# Patient Record
Sex: Female | Born: 1950 | Race: White | Hispanic: No | Marital: Married | Smoking: Never smoker
Health system: Southern US, Community
[De-identification: ages and names within clinical notes are randomized; demographics above are authoritative.]

## PROBLEM LIST (undated history)

## (undated) DIAGNOSIS — J309 Allergic rhinitis, unspecified: Secondary | ICD-10-CM

## (undated) DIAGNOSIS — I10 Essential (primary) hypertension: Secondary | ICD-10-CM

## (undated) DIAGNOSIS — C50919 Malignant neoplasm of unspecified site of unspecified female breast: Secondary | ICD-10-CM

## (undated) HISTORY — DX: Malignant neoplasm of unspecified site of unspecified female breast: C50.919

## (undated) HISTORY — DX: Essential (primary) hypertension: I10

## (undated) HISTORY — DX: Allergic rhinitis, unspecified: J30.9

---

## 2008-01-09 ENCOUNTER — Emergency Department (HOSPITAL_COMMUNITY): Admission: EM | Admit: 2008-01-09 | Discharge: 2008-01-09 | Payer: Self-pay | Admitting: Emergency Medicine

## 2008-01-10 ENCOUNTER — Ambulatory Visit (HOSPITAL_COMMUNITY): Admission: RE | Admit: 2008-01-10 | Discharge: 2008-01-10 | Payer: Self-pay | Admitting: Emergency Medicine

## 2008-01-13 ENCOUNTER — Ambulatory Visit: Payer: Self-pay | Admitting: Gastroenterology

## 2008-01-17 ENCOUNTER — Encounter (HOSPITAL_COMMUNITY): Admission: RE | Admit: 2008-01-17 | Discharge: 2008-02-16 | Payer: Self-pay | Admitting: Internal Medicine

## 2008-01-28 ENCOUNTER — Encounter (INDEPENDENT_AMBULATORY_CARE_PROVIDER_SITE_OTHER): Payer: Self-pay | Admitting: General Surgery

## 2008-01-28 ENCOUNTER — Ambulatory Visit (HOSPITAL_COMMUNITY): Admission: RE | Admit: 2008-01-28 | Discharge: 2008-01-28 | Payer: Self-pay | Admitting: General Surgery

## 2010-06-15 IMAGING — NM NM HEPATO W/GB/PHARM/[PERSON_NAME]
2 series · 12 of 12 positions shown · non-contrast
Comparison: No prior nuclear medicine examination..

CLINICAL DATA: Right upper quadrant pain.

NUCLEAR MEDICINE HEPATOBILIARY IMAGING WITH GALLBLADDER EF
TECHNIQUE: Sequential images of the abdomen were obtained [DATE] minutes following intravenous administration of
radiopharmaceutical. After oral ingestion of 8oz of half and half
cream, gallbladder ejection fraction was determined.
Radiopharmaceutical:  Five.mCi Nc-OOm Choletec

[Series 1: hepatobiliary · 3.20mm/px · 6 of 60 frames shown (1 of 2)]
[frame 6/60]
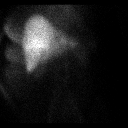
[frame 16/60]
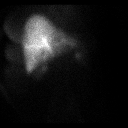
[frame 26/60]
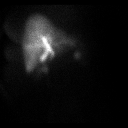
[frame 36/60]
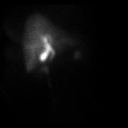
[frame 46/60]
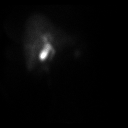
[frame 56/60]
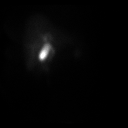

[Series 1: hepatobiliary · 3.20mm/px · 6 of 60 frames shown (2 of 2)]
[frame 6/60]
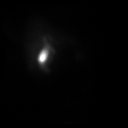
[frame 16/60]
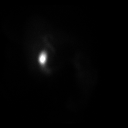
[frame 26/60]
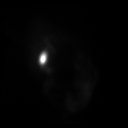
[frame 36/60]
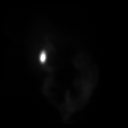
[frame 46/60]
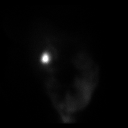
[frame 56/60]
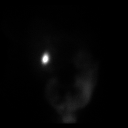

[12 of 12 positions shown; findings below may reference images not displayed]

FINDINGS: Homogeneous uptake by the liver.  Gallbladder visualized
within the first 25 minutes.  Small portions of small bowel
visualized within the first 45 minutes.

1 hour after ingestion of 8 ounces of half half, calculated
gallbladder ejection fraction is 28.5% which is abnormal as one
would expect ejection fraction of greater than 50%.
IMPRESSION: Decreased gallbladder ejection fraction of 28.5%.

## 2010-10-22 NOTE — H&P (Signed)
NAME:  Kathleen Kelly, Kathleen Kelly NO.:  [id number]   MEDICAL RECORD NO.:  [id number]          PATIENT TYPE:  AMB   LOCATION:  DAY                           FACILITY:  [facility]   PHYSICIAN:  Tilford Pillar, MD      DATE OF BIRTH:  07-17-1950   DATE OF ADMISSION:  DATE OF DISCHARGE:  LH                              HISTORY & PHYSICAL   CHIEF COMPLAINT:  Gallbladder.   HISTORY OF PRESENT ILLNESS:  The patient is a 60 year old female who was  referred to my office with approximately 68-month history of abdominal  pain.  This has been primarily in the right upper quadrant at the  epigastrium.  It is worse after eating, in particular fatty greasy foods  and has noted no significant change with pain medication.  She describes  the pain as relatively persistent and occasional nausea.  She denied any  fever but occasional chills.  No episodes of jaundice.  She does have a  significant family of gallbladder disease.  She denies any melena or  hematochezia.  No change in urination.   PAST MEDICAL HISTORY:  1. Hypertension.  2. Hypercholesterolemia.  3. Seasonal allergies.   PAST SURGICAL HISTORY:  1. Hysterectomy.  2. Appendectomy.  3. Tonsils and adenoidectomy.  4. History of foot surgery.   MEDICATIONS:  The patient is currently on Hyzaar, aspirin 81 mg,  omeprazole, and Zyrtec in the spring and fall.   ALLERGIES:  No known drug allergies.   SOCIAL HISTORY:  No tobacco and no alcohol use.   PREGNANCIES:  Three.   PERTINENT FAMILY HISTORY:  History of colon cancer,   REVIEW OF SYSTEMS:  CONSTITUTIONAL:  Unremarkable.  EYES:  Unremarkable.  EAR, NOSE AND THROAT:  Occasional rhinorrhea.  RESPIRATORY:  Unremarkable.  CARDIOVASCULAR:  Unremarkable.  GASTROINTESTINAL:  Abdominal pain, nausea, and vomiting as per HPI.  GENITOURINARY:  Unremarkable.  MUSCULOSKELETAL:  Unremarkable.  SKIN:  Unremarkable.  ENDOCRINE:  Unremarkable.  NEURO:  Unremarkable.   PHYSICAL  EXAMINATION:  GENERAL:  The patient is a healthy-appearing calm  female of age appropriate appearance.  HEENT:  Scalp, no deformities and no masses.  EYES:  Pupils equal, round, and reactive.  Extraocular movements are  intact.  No scleral icterus.  No conjunctival pallor.  Oral mucosa is  pink.  Normal occlusion.  NECK:  Trachea is midline.  No cervical lymphadenopathy is appreciated.  PULMONARY:  Unlabored respirations.  No wheezes and no crackles.  She is  clear to auscultation bilaterally.  CARDIOVASCULAR:  Regular rate and rhythm.  No murmurs and no gallops.  A  2+ radial and dorsalis pedis pulses bilaterally.  ABDOMEN:  Positive  bowel sounds.  Abdomen is soft.  She does have right upper quadrant  abdominal pain.  She does elicited Murphy sign.  No hernia, no masses,  and no diffuse peritoneal signs.  SKIN:  Warm and dry.   PERTINENT LABORATORY AND RADIOGRAPHIC STUDIES:  Right upper quadrant  ultrasound was negative and demonstrated no evidence of any biliary  stones or ductal dilatation.  She did have a HIDA scan demonstrating a  28.5% ejection fraction, and more importantly, this did elicit her  symptoms with the ingestion of the cream.   ASSESSMENT AND PLAN:  Biliary dyskinesia.  At this point, I had a  discussion with the patient in regards to her increase in symptomatology  with the HIDA scan consistent with biliary dyskinesia.  Risks, benefits,  and alternatives of a laparoscopic possible open cholecystectomy were  discussed with the patient including but not limited to the risk of  bleeding, infection, bile leak, small bowel injury, bile duct injury, as  well as possibility of intraoperative cardiac and pulmonary events.  During this discussion, she also explained that she has a history of  vasovagal episodes with  venipuncture and has had bradycardic episodes with initiation of an IV  in the past.  This has been treated in the past with Valium with good  success.  At  this point, I discussed with the patient plans for preop  management and we will proceed with surgical intervention at the  patient's earliest convenience.      Tilford Pillar, MD  Electronically Signed     BZ/MEDQ  D:  01/26/2008  T:  01/27/2008  Job:  [id number]   cc:   Dr. Lacy Duverney, M.D.  [address]  [place] , [place] [address]   R. Roetta Sessions, M.D.  [address]  [place]  [place] [address]   Tana Coast, P.A.

## 2010-10-22 NOTE — Consult Note (Signed)
NAME:  Kathleen Kelly, Kathleen Kelly NO.:  [id number]   MEDICAL RECORD NO.:  [id number]          PATIENT TYPE:  EMS   LOCATION:  ED                            FACILITY:  [facility]   PHYSICIAN:  R. Roetta Sessions, M.D. DATE OF BIRTH:  [birth year]   DATE OF CONSULTATION:  01/13/2008  DATE OF DISCHARGE:                                 CONSULTATION   DATE OF CONSULTATION:  January 13, 2008.   REASON FOR CONSULTATION:  Abdominal pain.   REQUESTING PHYSICIAN:  Dictation ended at this point.      Tana Coast, P.AJonathon Bellows, M.D.  Electronically Signed    LL/MEDQ  D:  01/13/2008  T:  01/14/2008  Job:  [id number]

## 2010-10-22 NOTE — Assessment & Plan Note (Signed)
NAMEMarland Kitchen  CARRYE, GOLLER              CHART#:  [id number]   DATE:  01/13/2008                       DOB:  [birth year]   CHIEF COMPLAINT:  Abdominal pain, not able to eat.   HISTORY OF PRESENT ILLNESS:  The patient is a pleasant 60 year old lady  who presents today as a self-referral for further evaluation of  abdominal pain.  Over the past 3 weeks, she has had postprandial  epigastric/right upper quadrant abdominal pain, which has been  progressive.  She states, initially, she just thought it was gas.  She  has now noticed postprandial component with nausea as well.  It does not  seem to really matter what she eats at this point.  She denies any  typical heartburn symptoms, currently on Prilosec.  She notices if she  eats a more significant quantity of food, she has more pain.  It has  been keeping her up at night.  She also notes it has been worse with  fried foods.  She has been eating a lot of fresh vegetables and had  wondered if that had caused the problems.  She has chronic alternating  constipation and diarrhea, but leans mostly towards the constipated  side.  She generally manages this by eating high-fiber cereals.  She  denies any blood in the stool or melena.  She went to the emergency  department on January 09, 2008, because the pain was severe.  She had a  normal CBC, MET-7, LFTs, lipase, and cardiac enzymes.  She had a mild  UTI, which she was treated for.  Apparently, she had an episode of  syncope and low blood pressure and pulse when she was getting her IV put  in.  She was told it was a vasovagal response.  However, they also  thought she had some AFib and they sent her to Dr. Earna Coder, a  cardiologist in [place].  She again had an extensive workup there.  She  had an abdominal ultrasound, which revealed a gallbladder polyp, but  otherwise negative exam.   CURRENT MEDICATIONS:  1. Prilosec 20 mg daily.  2. Hyzaar 100/12.5 mg daily.  3. Aspirin 81 mg daily.  4. Zyrtec  p.r.n.  5. Gas-X p.r.n.   ALLERGIES:  No known drug allergies.   PAST MEDICAL HISTORY:  Hypertension, GERD, and allergies.   PAST SURGICAL HISTORY:  Hysterectomy, appendectomy, right foot surgery,  and colonoscopy, 2 years, in [place], [place], was told she had no  polyps.   FAMILY HISTORY:  Mother at 35 was treated for colon cancer, father at  age 40 killed by a drunk driver.   SOCIAL HISTORY:  She is married.  She has 3 children.  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  She is nonsmoker.  Occasionally, consumes alcohol.   REVIEW OF SYSTEMS:  See HPI for GI.  Constitutional:  No weight loss.  Cardiopulmonary:  No chest pain or shortness of breath.  Genitourinary:  No further dysuria or hematuria.   PHYSICAL EXAMINATION:  VITAL SIGNS:  Weight 165.5, height 5 feet 7  inches, temp 98.1, blood pressure 106/80, and pulse 68.  GENERAL:  Pleasant, well-nourished, well-developed Caucasian female in  no acute distress.  SKIN:  Warm and dry.  No jaundice.  HEENT:  Sclerae nonicteric.  Oropharyngeal mucosa moist and pink.  No  lesions, erythema, or exudate.  No lymphadenopathy or thyromegaly.  CHEST:  Lungs are clear to auscultation.  CARDIAC:  Regular rate and rhythm.  Normal S1 and S2.  No murmurs, rubs,  or gallops.  ABDOMEN:  Positive bowel sounds.  Abdomen is soft.  She has moderate  tenderness in the right upper quadrant region to deep palpation.  Positive Murphy sign.  No organomegaly or masses.  No abdominal bruits  or hernias.  No rebound or guarding.  LOWER EXTREMITIES:  No edema.   IMPRESSION:  The patient is a 60 year old lady with postprandial right  upper quadrant/epigastric pain associated with nausea, but no vomiting.  Symptoms have been ongoing for 3 weeks.  She has been maintained on  Prilosec chronically with good control of her reflux.  She is really at  low risk for peptic ulcer disease.  I am suspicious of biliary etiology.   PLAN:  1. HIDA scan with fatty meal  challenge.  2. I have given her Valium 5 mg p.o. 1 hour before her test to      hopefully prevent any syncopal episodes related to her IV access.      I have discussed this with Nuclear Medicine and they agree with      this plan and do not feel that there will be any interference with      the study.  3. If HIDA scan is unremarkable or borderline, we will pursue EGD as      the next step.       Tana Coast, P.A.  Electronically Signed     R. Roetta Sessions, M.D.  Electronically Signed    LL/MEDQ  D:  01/13/2008  T:  01/13/2008  Job:  [id number]   cc:   Lorin Picket. Charlynne Cousins, MD

## 2010-10-22 NOTE — Op Note (Signed)
NAME:  Kathleen Kelly, Kathleen Kelly NO.:  [id number]   MEDICAL RECORD NO.:  [id number]          PATIENT TYPE:  AMB   LOCATION:  DAY                           FACILITY:  [facility]   PHYSICIAN:  Tilford Pillar, MD      DATE OF BIRTH:  Aug 07, 1950   DATE OF PROCEDURE:  01/28/2008  DATE OF DISCHARGE:                               OPERATIVE REPORT   PREOP DIAGNOSIS:  Biliary dyskinesia.   POSTOP DIAGNOSIS:  Biliary dyskinesia.   PROCEDURE:  Laparoscopic cholecystectomy.   SURGEON:  Tilford Pillar, MD   ANESTHESIA:  General endotracheal local anesthetic 1% Sensorcaine plain.   SPECIMEN:  Gallbladder.   ESTIMATED BLOOD LOSS:  Minimal.   INDICATIONS:  The patient is a 60 year old female who presented to my  office with history of right upper quadrant and abdominal pain.  She had  had a preoperative evaluation by her primary care physician as well as  in the emergency department.  She did have a HIDA scan which  demonstrated a diffused biliary ejection fraction more importantly and  also demonstrated exacerbation of her symptomatology.  The risks,  benefits, and alternatives of laparoscopic possible open cholecystectomy  were discussed at length with the patient.  The patient's questions and  concerns were addressed.  The patient was then over the planned  procedure.   OPERATION:  The patient was taken to the operating room, placed in  supine position on the operating table.  At which time, the general  anesthetic was administered.  Once the patient was asleep, she was  endotracheally intubated by anesthesia.  At this point, her abdomen was  prepped and draped in usual fashion using DuraPrep solution.  An  infraumbilical stab incision was created with a 11 blade scalpel.  Additional dissection down to subcuticular tissue was carried out using  a Kelly clamp which we utilized to grasp the anterior abdominal wall  fascia and lift this anteriorly and Veress needle was inserted.   Saline  drop test was utilized to confirm intraperitoneal placement, and then  pneumoperitoneum was initiated.  Once sufficient pneumoperitoneum was  obtained, the 11-mm trocar was inserted over the laparoscope allowing  visualization of trocar entering into the peritoneal cavity.  At this  point, the inner cannula was removed.  The laparoscope was reinserted.  There was no evidence of any trocar or Veress needle placement injury.  The remaining trocars were placed in the similar fashion, creating a  stab incision, and placement of the trocar under direct visualization.  A 11 mm trocar was first placed in the epigastrium, 5 mm trocar was  placed in the midline between 2 mm trocars and the 5 mm trocar was  placed in a right lateral abdominal wall.  The patient was placed in a  reverse Trendelenburg left lateral decubitus position and the fundus of  the gallbladder was grasped with a regular grasper and set up over the  right lobe of the liver.  The infundibulum was identified.  The  peritoneum was bluntly stripped off of the infundibulum exposing the  cystic duct.  A window was created behind  the cystic duct and 3  endoclips were placed proximally and  1 distally and cystic duct was  divided between 2 most distal clips.  Similarly, the cystic artery was  identified and a window was created behind the cystic artery, 2  endoclips placed proximally and 1 distally, and cystic artery was  divided between the 2 most distal clips.  Electrocautery was then  utilized to dissect the gallbladder free from the gallbladder fossa.  Once this was free, it was placed in the EndoCatch bag, which was placed  up and over the right lobe of the liver.   At this time, attention was turned to closure.  A 2-0 Vicryl suture was  placed using an Endoclose suture passing device through both the 11 mm  trocar sites.  With these sutures in place, the gallbladder was removed  and intact EndoCatch bag through the  epigastric trocar site was placed  on the back table and was sent as a permanent specimen to pathology.  Pneumoperitoneum was then evacuated.  The Vicryl sutures were secured.  Local anesthetic was instilled and 4-0 Monocryl was utilized in a  running subcuticular suture to reapproximate the skin edges at all 4  trocar sites.  The skin was washed and dried with moistened dry towel.  Benzoin was applied around incision.  Half inch Steri-Strips were  placed.  The drapes were removed.  The patient was allowed to come out  of general anesthetic and was transferred back to regular hospital and  was transferred to Postanesthetic Care Unit in stable condition.  At the  conclusion of the procedure, all instrument, sponge, and needle counts  were correct.  He tolerated the procedure well.      Tilford Pillar, MD  Electronically Signed     BZ/MEDQ  D:  01/28/2008  T:  01/28/2008  Job:  [id number]   cc:   Dr. Charlynne Cousins

## 2011-03-07 LAB — URINE MICROSCOPIC-ADD ON

## 2011-03-07 LAB — DIFFERENTIAL
Basophils Absolute: 0
Eosinophils Absolute: 0.2
Eosinophils Relative: 3
Monocytes Absolute: 0.3

## 2011-03-07 LAB — URINALYSIS, ROUTINE W REFLEX MICROSCOPIC
Bilirubin Urine: NEGATIVE
Glucose, UA: NEGATIVE
Ketones, ur: NEGATIVE
Nitrite: NEGATIVE
Specific Gravity, Urine: 1.01
pH: 6

## 2011-03-07 LAB — COMPREHENSIVE METABOLIC PANEL
ALT: 15
AST: 18
Albumin: 3.8
Alkaline Phosphatase: 57
CO2: 29
Chloride: 106
Creatinine, Ser: 0.78
GFR calc Af Amer: >60
GFR calc non Af Amer: >60
Potassium: 3.3 — ABNORMAL LOW
Sodium: 140
Total Bilirubin: 0.7

## 2011-03-07 LAB — CBC
MCV: 92.2
Platelets: 313
RBC: 4.45
WBC: 6.2

## 2011-03-07 LAB — LIPASE, BLOOD: Lipase: 21

## 2011-03-07 LAB — POCT CARDIAC MARKERS: Troponin i, poc: <0.05

## 2021-01-21 ENCOUNTER — Ambulatory Visit (INDEPENDENT_AMBULATORY_CARE_PROVIDER_SITE_OTHER): Payer: Medicare Other | Admitting: Pulmonary Disease

## 2021-01-21 ENCOUNTER — Encounter: Payer: Self-pay | Admitting: Pulmonary Disease

## 2021-01-21 ENCOUNTER — Other Ambulatory Visit: Payer: Self-pay

## 2021-01-21 VITALS — BP 136/90 | HR 94 | Temp 98.1°F | Ht 67.0 in | Wt 171.2 lb

## 2021-01-21 DIAGNOSIS — U099 Post covid-19 condition, unspecified: Secondary | ICD-10-CM | POA: Diagnosis not present

## 2021-01-21 DIAGNOSIS — R062 Wheezing: Secondary | ICD-10-CM | POA: Diagnosis not present

## 2021-01-21 DIAGNOSIS — R0602 Shortness of breath: Secondary | ICD-10-CM | POA: Diagnosis not present

## 2021-01-21 DIAGNOSIS — R0609 Other forms of dyspnea: Secondary | ICD-10-CM

## 2021-01-21 DIAGNOSIS — I10 Essential (primary) hypertension: Secondary | ICD-10-CM | POA: Insufficient documentation

## 2021-01-21 MED ORDER — BUDESONIDE-FORMOTEROL FUMARATE 160-4.5 MCG/ACT IN AERO: 2.0000 | INHALATION_SPRAY | Freq: Two times a day (BID) | RESPIRATORY_TRACT | 6 refills | Status: AC

## 2021-01-21 NOTE — Progress Notes (Signed)
Subjective:   PATIENT ID: Kathleen Kelly GENDER: female DOB: 1950-12-12, MRN: [id number]   HPI  Chief Complaint  Patient presents with   Consult    Covid 06/2020 SOB started after, SOB with activity     Reason for Visit: New consult for shortness of breath  Ms. Kathleen Kelly is a 70 year old female with HTN and allergic rhinitis who presents who presents for new consultation for shortness of breath.  She was diagnosed with COVID-19 infection in January which manifested as sore throat, dry cough. Since then she reports fatigue, dry cough and shortness of breath. Sometimes worsen with allergies and changes in humidity associated wheezing which she never had issues like this before. Last year she used to be able to clean the house with stopping, able to take walks on the beach, shopping. In June when she was in [place] summer rental home, she has been less active and having difficulty climbing sand dunes. She has been started on anxiety meds.  Social History: Never   I have personally reviewed patient's past medical/family/social history, allergies, current medications.  Past Medical History:  Diagnosis Date   Allergic rhinitis    Breast cancer ([place])    Hypertension      Family History  Problem Relation Age of Onset   Cancer - Lung Mother      Social History   Occupational History   Not on file  Tobacco Use   Smoking status: Never   Smokeless tobacco: Never  Substance and Sexual Activity   Alcohol use: Not on file   Drug use: Not on file   Sexual activity: Not on file    No Known Allergies   Outpatient Medications Prior to Visit  Medication Sig Dispense Refill   aspirin 81 MG chewable tablet Chew by mouth.     atorvastatin (LIPITOR) 10 MG tablet Take by mouth.     busPIRone (BUSPAR) 10 MG tablet Take 10 mg by mouth 2 (two) times daily.     celecoxib (CELEBREX) 200 MG capsule Take by mouth.     Fexofenadine-Pseudoephedrine (ALLEGRA-D 24 HOUR PO) Take by  mouth.     hydrochlorothiazide (MICROZIDE) 12.5 MG capsule Take 12.5 mg by mouth daily.     lisinopril (ZESTRIL) 40 MG tablet Take 40 mg by mouth daily.     tamoxifen (NOLVADEX) 20 MG tablet Take by mouth.     No facility-administered medications prior to visit.    Review of Systems  Constitutional:  Positive for malaise/fatigue. Negative for chills, diaphoresis, fever and weight loss.  HENT:  Negative for congestion, ear pain and sore throat.   Respiratory:  Positive for cough and shortness of breath. Negative for hemoptysis, sputum production and wheezing.   Cardiovascular:  Negative for chest pain, palpitations and leg swelling.  Gastrointestinal:  Negative for abdominal pain, heartburn and nausea.  Genitourinary:  Negative for frequency.  Musculoskeletal:  Negative for joint pain and myalgias.  Skin:  Negative for itching and rash.  Neurological:  Negative for dizziness, weakness and headaches.  Endo/Heme/Allergies:  Does not bruise/bleed easily.  Psychiatric/Behavioral:  Negative for depression. The patient is nervous/anxious.     Objective:   Vitals:   01/21/21 1026  BP: 136/90  Pulse: 94  Temp: 98.1 F (36.7 C)  TempSrc: Oral  SpO2: 98%  Weight: 171 lb 3.2 oz (77.7 kg)  Height: 5' 7"  (1.702 m)   SpO2: 98 % O2 Device: None (Room air)  Physical Exam: General: Well-appearing, no  acute distress HENT: [place], AT Eyes: EOMI, no scleral icterus Respiratory: Clear to auscultation bilaterally.  No crackles, wheezing or rales Cardiovascular: RRR, -M/R/G, no JVD Extremities:-Edema,-tenderness Neuro: AAO x4, CNII-XII grossly intact Skin: Intact, no rashes or bruising Psych: Normal mood, normal affect  Data Reviewed:  Imaging: CXR 01/09/08 - Mild hyperinflation. No infiltrate, effusion or edema  PFT: None on file  Labs: BMET    Component Value Date/Time   NA 140 01/09/2008 1048   K 3.3 (L) 01/09/2008 1048   CL 106 01/09/2008 1048   CO2 29 01/09/2008 1048   GLUCOSE  89 01/09/2008 1048   BUN 13 01/09/2008 1048   CREATININE 0.78 01/09/2008 1048   CALCIUM 10.0 01/09/2008 1048   GFRNONAA >60 01/09/2008 1048   GFRAA  01/09/2008 1048    >60        The eGFR has been calculated using the MDRD equation. This calculation has not been validated in all clinical   Na 12/01/20 131 - Post IVF Na 12/19/20 130 - PCP   Reports normal thyroid labs per patient  Assessment & Plan:   Discussion: 70 year old with prior COVID-19 infection with persistent fatigue and shortness of breath. We spent time discussing the clinical course and management of post-covid symptoms. Offered trial of LABA/ICS as this potentially can provide clinical benefit. Will also evaluate with PFTs to determine any functional deficit related to recent viral illness.  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 Never smoker  Orders Placed This Encounter  Procedures   Pulmonary function test    Standing Status:   Future    Standing Expiration Date:   01/21/2022    Order Specific Question:   Where should this test be performed?    Answer:   [facility]   Meds ordered this encounter  Medications   budesonide-formoterol (SYMBICORT) 160-4.5 MCG/ACT inhaler    Sig: Inhale 2 puffs into the lungs in the morning and at bedtime.    Dispense:  1 each    Refill:  6    No follow-ups on file.  I have spent a total time of 45-minutes on the day of the appointment reviewing prior documentation, coordinating care and discussing medical diagnosis and plan with the patient/family. Imaging, labs and tests included in this note have been reviewed and interpreted independently by  me.  [place], MD [facility] 01/21/2021 3:34 PM  Office Number [phone]

## 2021-01-21 NOTE — Patient Instructions (Addendum)
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

## 2021-01-23 ENCOUNTER — Ambulatory Visit: Payer: BLUE CROSS/BLUE SHIELD

## 2021-01-23 ENCOUNTER — Other Ambulatory Visit: Payer: Self-pay

## 2021-01-23 DIAGNOSIS — R062 Wheezing: Secondary | ICD-10-CM

## 2021-01-23 DIAGNOSIS — R0602 Shortness of breath: Secondary | ICD-10-CM

## 2021-01-23 DIAGNOSIS — U099 Post covid-19 condition, unspecified: Secondary | ICD-10-CM

## 2021-01-23 LAB — PULMONARY FUNCTION TEST
DL/VA % pred: 99 %
DL/VA: 4.07 ml/min/mmHg/L
DLCO cor % pred: 95 %
DLCO cor: 19.79 ml/min/mmHg
DLCO unc % pred: 94 %
DLCO unc: 19.6 ml/min/mmHg
FEF 25-75 Post: 3.26 L/sec
FEF 25-75 Pre: 2.85 L/sec
FEF2575-%Change-Post: 14 %
FEF2575-%Pred-Post: 165 %
FEF2575-%Pred-Pre: 144 %
FEV1-%Change-Post: 2 %
FEV1-%Pred-Post: 110 %
FEV1-%Pred-Pre: 107 %
FEV1-Post: 2.68 L
FEV1-Pre: 2.61 L
FEV1FVC-%Change-Post: 0 %
FEV1FVC-%Pred-Pre: 109 %
FEV6-%Change-Post: 1 %
FEV6-%Pred-Post: 104 %
FEV6-%Pred-Pre: 102 %
FEV6-Post: 3.2 L
FEV6-Pre: 3.15 L
FEV6FVC-%Pred-Post: 104 %
FEV6FVC-%Pred-Pre: 104 %
FVC-%Change-Post: 2 %
FVC-%Pred-Post: 100 %
FVC-%Pred-Pre: 98 %
FVC-Post: 3.22 L
FVC-Pre: 3.15 L
Post FEV1/FVC ratio: 83 %
Post FEV6/FVC ratio: 100 %
Pre FEV1/FVC ratio: 83 %
Pre FEV6/FVC Ratio: 100 %
RV % pred: 89 %
RV: 2.06 L
TLC % pred: 96 %
TLC: 5.17 L

## 2021-01-29 NOTE — Telephone Encounter (Signed)
Pulmonary function tests were reviewed and normal. We can discuss her results in more detail at her next visit. We will also assess how she is doing on her inhaler at that time as well.

## 2021-01-29 NOTE — Telephone Encounter (Signed)
Mychart message sent by pt requesting the results of her recent PFT. Dr. Loanne Drilling, please advise.

## 2021-02-05 ENCOUNTER — Encounter: Payer: Self-pay | Admitting: Adult Health

## 2021-02-05 ENCOUNTER — Other Ambulatory Visit: Payer: Self-pay

## 2021-02-05 ENCOUNTER — Ambulatory Visit (INDEPENDENT_AMBULATORY_CARE_PROVIDER_SITE_OTHER): Payer: Medicare Other | Admitting: Adult Health

## 2021-02-05 DIAGNOSIS — J4530 Mild persistent asthma, uncomplicated: Secondary | ICD-10-CM

## 2021-02-05 DIAGNOSIS — U099 Post covid-19 condition, unspecified: Secondary | ICD-10-CM

## 2021-02-05 DIAGNOSIS — J45909 Unspecified asthma, uncomplicated: Secondary | ICD-10-CM | POA: Insufficient documentation

## 2021-02-05 DIAGNOSIS — R0609 Other forms of dyspnea: Secondary | ICD-10-CM

## 2021-02-05 NOTE — Assessment & Plan Note (Signed)
Patient is having slow clinical improvement.  Continue with activity as tolerated.  Continue on current regimen.  Plan  Patient Instructions  Continue on Symbicort 2 puffs Twice daily, rinse after use.  Activity as tolerated. Exercise as able .  Flu shot and 2nd Covid booster this Fall as discussed  Follow with Dr. Loanne Drilling in 3 months and As needed

## 2021-02-05 NOTE — Assessment & Plan Note (Signed)
Mild persistent Asthma vs RAD post viral infection -much improved with Symbicort.  Pulmonary function testing shows normal lung function with no sign of airflow obstruction or restriction. Would continue on Symbicort and advance activity as tolerated. Follow-up in 3 months if doing well can de-escalate therapy.  Plan  Patient Instructions  Continue on Symbicort 2 puffs Twice daily, rinse after use.  Activity as tolerated. Exercise as able .  Flu shot and 2nd Covid booster this Fall as discussed  Follow with Dr. Loanne Drilling in 3 months and As needed

## 2021-02-05 NOTE — Progress Notes (Signed)
$'@Patient'P$  ID: Kathleen Kelly, female    DOB: [birth year], 70 y.o.   MRN: [id number]  Chief Complaint  Patient presents with   Follow-up    Referring provider: Shifflett, Lorelee Market  HPI: 70 year old female seen for pulmonary consult January 21, 2021 for shortness of breath and cough since January 2022 after COVID-19 infection.  TEST/EVENTS :   02/05/2021 Follow up:  Cough and Dyspnea  Patient returns for a 2-week follow-up.  Patient was seen last visit for pulmonary consult for shortness of breath and cough since January 2022.  She had COVID-19 infection.  Since January 2022 she had had some ongoing cough, congestion, shortness of breath decreased activity tolerance.  Last visit patient was started on Symbicort twice daily.  Since last visit she is starting to feel better.  She feels like this has really helped her breathing.  She has had increased activity tolerance and improved endurance.  She has been able to start back on some of her activities that she was doing prior to COVID-19 infection.  She is starting to walk she has been able to walk about a half a mile twice a day.  She is also started to walking in the pool.  She still has some ongoing fatigue and low energy but definitely feels like that she has improved in the last 2 weeks.  She was set up for pulmonary function testing which was completed January 23, 2021 that showed normal lung function with FEV1 at 110%, ratio 83, FVC 100%, no significant bronchodilator response, DLCO 94%. They do travel to [place] for the winter months.   No Known Allergies  Immunization History  Administered Date(s) Administered   Moderna Sars-Covid-2 Vaccination 08/06/2019, 01/06/2020, 05/11/2020    Past Medical History:  Diagnosis Date   Allergic rhinitis    Breast cancer ([place])    Hypertension     Tobacco History: Social History   Tobacco Use  Smoking Status Never  Smokeless Tobacco Never   Counseling given: Not  Answered   Outpatient Medications Prior to Visit  Medication Sig Dispense Refill   aspirin 81 MG chewable tablet Chew by mouth.     atorvastatin (LIPITOR) 10 MG tablet Take by mouth.     budesonide-formoterol (SYMBICORT) 160-4.5 MCG/ACT inhaler Inhale 2 puffs into the lungs in the morning and at bedtime. 1 each 6   busPIRone (BUSPAR) 10 MG tablet Take 10 mg by mouth 2 (two) times daily.     celecoxib (CELEBREX) 200 MG capsule Take by mouth.     Fexofenadine-Pseudoephedrine (ALLEGRA-D 24 HOUR PO) Take by mouth.     hydrochlorothiazide (MICROZIDE) 12.5 MG capsule Take 12.5 mg by mouth daily.     lisinopril (ZESTRIL) 40 MG tablet Take 40 mg by mouth daily.     tamoxifen (NOLVADEX) 20 MG tablet Take by mouth.     No facility-administered medications prior to visit.     Review of Systems:   Constitutional:   No  weight loss, night sweats,  Fevers, chills, fatigue, or  lassitude.  HEENT:   No headaches,  Difficulty swallowing,  Tooth/dental problems, or  Sore throat,                No sneezing, itching, ear ache, nasal congestion, post nasal drip,   CV:  No chest pain,  Orthopnea, PND, swelling in lower extremities, anasarca, dizziness, palpitations, syncope.   GI  No heartburn, indigestion, abdominal pain, nausea, vomiting, diarrhea, change in bowel habits, loss of appetite, bloody stools.  Resp: No shortness of breath with exertion or at rest.  No excess mucus, no productive cough,  No non-productive cough,  No coughing up of blood.  No change in color of mucus.  No wheezing.  No chest wall deformity  Skin: no rash or lesions.  GU: no dysuria, change in color of urine, no urgency or frequency.  No flank pain, no hematuria   MS:  No joint pain or swelling.  No decreased range of motion.  No back pain.    Physical Exam  BP (!) 142/76 (BP Location: Left Arm, Patient Position: Sitting, Cuff Size: Normal)   Pulse 76   Temp 98.1 F (36.7 C) (Oral)   Ht '5\' 7"'$  (1.702 m)   Wt 170  lb 12.8 oz (77.5 kg)   SpO2 99%   BMI 26.75 kg/m   GEN: A/Ox3; pleasant , NAD, well nourished    HEENT:  [place]/AT,   NOSE-clear, THROAT-clear, no lesions, no postnasal drip or exudate noted.   NECK:  Supple w/ fair ROM; no JVD; normal carotid impulses w/o bruits; no thyromegaly or nodules palpated; no lymphadenopathy.    RESP  Clear  P & A; w/o, wheezes/ rales/ or rhonchi. no accessory muscle use, no dullness to percussion  CARD:  RRR, no m/r/g, no peripheral edema, pulses intact, no cyanosis or clubbing.  GI:   Soft & nt; nml bowel sounds; no organomegaly or masses detected.   Musco: Warm bil, no deformities or joint swelling noted.   Neuro: alert, no focal deficits noted.    Skin: Warm, no lesions or rashes    Lab Results:  CBC    BNP No results found for: BNP  ProBNP No results found for: PROBNP  Imaging: No results found.    PFT Results Latest Ref Rng & Units 01/23/2021  FVC-Pre L 3.15  FVC-Predicted Pre % 98  FVC-Post L 3.22  FVC-Predicted Post % 100  Pre FEV1/FVC % % 83  Post FEV1/FCV % % 83  FEV1-Pre L 2.61  FEV1-Predicted Pre % 107  FEV1-Post L 2.68  DLCO uncorrected ml/min/mmHg 19.60  DLCO UNC% % 94  DLCO corrected ml/min/mmHg 19.79  DLCO COR %Predicted % 95  DLVA Predicted % 99  TLC L 5.17  TLC % Predicted % 96  RV % Predicted % 89    No results found for: NITRICOXIDE      Assessment & Plan:   Mild asthma Mild persistent Asthma vs RAD post viral infection -much improved with Symbicort.  Pulmonary function testing shows normal lung function with no sign of airflow obstruction or restriction. Would continue on Symbicort and advance activity as tolerated. Follow-up in 3 months if doing well can de-escalate therapy.  Plan  Patient Instructions  Continue on Symbicort 2 puffs Twice daily, rinse after use.  Activity as tolerated. Exercise as able .  Flu shot and 2nd Covid booster this Fall as discussed  Follow with Dr. Loanne Drilling in 3 months  and As needed       COVID-19 long hauler manifesting chronic dyspnea Patient is having slow clinical improvement.  Continue with activity as tolerated.  Continue on current regimen.  Plan  Patient Instructions  Continue on Symbicort 2 puffs Twice daily, rinse after use.  Activity as tolerated. Exercise as able .  Flu shot and 2nd Covid booster this Fall as discussed  Follow with Dr. Loanne Drilling in 3 months and As needed       I spent   30 minutes dedicated to  the care of this patient on the date of this encounter to include pre-visit review of records, face-to-face time with the patient discussing conditions above, post visit ordering of testing, clinical documentation with the electronic health record, making appropriate referrals as documented, and communicating necessary findings to members of the patients care team.    Rexene Edison, NP 02/05/2021

## 2021-02-05 NOTE — Patient Instructions (Signed)
Continue on Symbicort 2 puffs Twice daily, rinse after use.  Activity as tolerated. Exercise as able .  Flu shot and 2nd Covid booster this Fall as discussed  Follow with Dr. Loanne Drilling in 3 months and As needed

## 2021-05-17 ENCOUNTER — Other Ambulatory Visit: Payer: Self-pay

## 2021-05-17 ENCOUNTER — Encounter: Payer: Self-pay | Admitting: Pulmonary Disease

## 2021-05-17 ENCOUNTER — Ambulatory Visit (INDEPENDENT_AMBULATORY_CARE_PROVIDER_SITE_OTHER): Payer: Medicare Other | Admitting: Pulmonary Disease

## 2021-05-17 VITALS — BP 140/100 | HR 80 | Temp 98.3°F | Ht 67.0 in | Wt 167.6 lb

## 2021-05-17 DIAGNOSIS — U099 Post covid-19 condition, unspecified: Secondary | ICD-10-CM

## 2021-05-17 DIAGNOSIS — R0609 Other forms of dyspnea: Secondary | ICD-10-CM | POA: Diagnosis not present

## 2021-05-17 NOTE — Patient Instructions (Signed)
COVID-19 long hauler manifesting as fatigue and shortness of breath - improved --CONTINUE Symbicort 160-4.5 mcg TWO puffs AS NEEDED  Follow-up with me as needed

## 2021-05-17 NOTE — Progress Notes (Signed)
Subjective:   PATIENT ID: Kathleen Kelly GENDER: female DOB: 07-Apr-1951, MRN: [id number]   HPI  Chief Complaint  Patient presents with   Follow-up    Follow up.     Reason for Visit: Follow-up  Ms. Kathleen Kelly is a 70 year old female with HTN and allergic rhinitis who presents who presents for follow-up  01/21/21 She was diagnosed with COVID-19 infection in January which manifested as sore throat, dry cough. Since then she reports fatigue, dry cough and shortness of breath. Sometimes worsen with allergies and changes in humidity associated wheezing which she never had issues like this before. Last year she used to be able to clean the house with stopping, able to take walks on the beach, shopping. In June when she was in [place] summer rental home, she has been less active and having difficulty climbing sand dunes. She has been started on anxiety meds.  05/17/21 Since our last visit she was seen by NP Parrett for PFTs which were normal. Symbicort is helping however self-discontinued to thrush for a few days. She reports increased humidity will affect her breathing. In November she had reduced Symbicort to one puff but while shopping she noticed that she felt weak and short of breath. Has been walking daily in the pool.  Social History: Never smoker  I have personally reviewed patient's past medical/family/social history, allergies, current medications.  Past Medical History:  Diagnosis Date   Allergic rhinitis    Breast cancer ([place])    Hypertension      Family History  Problem Relation Age of Onset   Cancer - Lung Mother      Social History   Occupational History   Not on file  Tobacco Use   Smoking status: Never   Smokeless tobacco: Never  Substance and Sexual Activity   Alcohol use: Not on file   Drug use: Not on file   Sexual activity: Not on file    No Known Allergies   Outpatient Medications Prior to Visit  Medication Sig Dispense Refill    aspirin 81 MG chewable tablet Chew by mouth.     atorvastatin (LIPITOR) 10 MG tablet Take by mouth.     budesonide-formoterol (SYMBICORT) 160-4.5 MCG/ACT inhaler Inhale 2 puffs into the lungs in the morning and at bedtime. 1 each 6   busPIRone (BUSPAR) 10 MG tablet Take 10 mg by mouth 2 (two) times daily.     celecoxib (CELEBREX) 200 MG capsule Take by mouth.     Fexofenadine-Pseudoephedrine (ALLEGRA-D 24 HOUR PO) Take by mouth.     hydrochlorothiazide (MICROZIDE) 12.5 MG capsule Take 12.5 mg by mouth daily.     lisinopril (ZESTRIL) 40 MG tablet Take 40 mg by mouth daily.     tamoxifen (NOLVADEX) 20 MG tablet Take by mouth.     No facility-administered medications prior to visit.    Review of Systems  Constitutional:  Negative for chills, diaphoresis, fever, malaise/fatigue and weight loss.  HENT:  Negative for congestion.   Respiratory:  Positive for shortness of breath. Negative for cough, hemoptysis, sputum production and wheezing.   Cardiovascular:  Negative for chest pain, palpitations and leg swelling.    Objective:   Vitals:   05/17/21 1103  BP: (!) 140/100  Pulse: 80  Temp: 98.3 F (36.8 C)  TempSrc: Oral  SpO2: 98%  Weight: 167 lb 9.6 oz (76 kg)  Height: 5' 7"  (1.702 m)   SpO2: 98 % O2 Device: None (Room air)  Physical Exam: General: Well-appearing, no acute distress HENT: [place], AT Eyes: EOMI, no scleral icterus Respiratory: Clear to auscultation bilaterally.  No crackles, wheezing or rales Cardiovascular: RRR, -M/R/G, no JVD Extremities:-Edema,-tenderness Neuro: AAO x4, CNII-XII grossly intact Psych: Normal mood, normal affect  Data Reviewed:  Imaging: CXR 01/09/08 - Mild hyperinflation. No infiltrate, effusion or edema  PFT: 06/07/21 FVC 3.22 (100%) FEV1 2.68 (110%) Ratio 83  TLC 96% DLCO 94% Interpretation: Normal PFTs. No significant bronchodilator response however does not preclude clinical benefit   Labs: BMET    Component Value Date/Time   NA  140 01/09/2008 1048   K 3.3 (L) 01/09/2008 1048   CL 106 01/09/2008 1048   CO2 29 01/09/2008 1048   GLUCOSE 89 01/09/2008 1048   BUN 13 01/09/2008 1048   CREATININE 0.78 01/09/2008 1048   CALCIUM 10.0 01/09/2008 1048   GFRNONAA >60 01/09/2008 1048   GFRAA  01/09/2008 1048    >60        The eGFR has been calculated using the MDRD equation. This calculation has not been validated in all clinical   Na 12/01/20 131 - Post IVF Na 12/19/20 130 - PCP   Reports normal thyroid labs per patient  Assessment & Plan:   Discussion: 70 year old with prior COVID-19 infection with persistent fatigue and shortness of breath. We spent time discussing the clinical course and management of post-covid symptoms. Offered trial of LABA/ICS as this potentially can provide clinical benefit. Will also evaluate with PFTs to determine any functional deficit related to recent viral illness.  70 year old female never smoker with COVID-19 infection who presents for follow-up. SOB improved however continues to have fatigue. Occasional shortness of breath. We discussed bronchodilator management as noted below.  COVID-19 long hauler manifesting as fatigue and shortness of breath - improved --CONTINUE Symbicort 160-4.5 mcg TWO puffs AS NEEDED  Health Maintenance Immunization History  Administered Date(s) Administered   Moderna Sars-Covid-2 Vaccination 08/06/2019, 01/06/2020, 05/11/2020   CT Lung Screen - not indicated. Never smoker  No orders of the defined types were placed in this encounter.  No orders of the defined types were placed in this encounter.   No follow-ups on file. PRN  I have spent a total time of 25-minutes on the day of the appointment reviewing prior documentation, coordinating care and discussing medical diagnosis and plan with the patient/family. Past medical history, allergies, medications were reviewed. Pertinent imaging, labs and tests included in this note have been reviewed and  interpreted independently by me.   [place], MD [facility] 05/17/2021 11:17 AM  Office Number [phone]

## 2021-06-07 ENCOUNTER — Encounter: Payer: Self-pay | Admitting: Pulmonary Disease
